# Patient Record
Sex: Male | Born: 1980 | Hispanic: Yes | Marital: Single | Smoking: Never smoker
Health system: Southern US, Community
[De-identification: ages and names within clinical notes are randomized; demographics above are authoritative.]

## PROBLEM LIST (undated history)

## (undated) DIAGNOSIS — E785 Hyperlipidemia, unspecified: Secondary | ICD-10-CM

## (undated) DIAGNOSIS — K469 Unspecified abdominal hernia without obstruction or gangrene: Secondary | ICD-10-CM

## (undated) DIAGNOSIS — T7840XA Allergy, unspecified, initial encounter: Secondary | ICD-10-CM

## (undated) HISTORY — DX: Allergy, unspecified, initial encounter: T78.40XA

## (undated) HISTORY — DX: Unspecified abdominal hernia without obstruction or gangrene: K46.9

## (undated) HISTORY — PX: LEG SURGERY: SHX1003

## (undated) HISTORY — DX: Hyperlipidemia, unspecified: E78.5

---

## 2010-12-18 ENCOUNTER — Encounter (INDEPENDENT_AMBULATORY_CARE_PROVIDER_SITE_OTHER): Payer: Self-pay | Admitting: General Surgery

## 2010-12-18 ENCOUNTER — Ambulatory Visit (INDEPENDENT_AMBULATORY_CARE_PROVIDER_SITE_OTHER): Payer: BC Managed Care – PPO | Admitting: General Surgery

## 2010-12-18 VITALS — BP 132/86 | HR 66 | Temp 97.2°F | Ht 63.0 in | Wt 207.8 lb

## 2010-12-18 DIAGNOSIS — K429 Umbilical hernia without obstruction or gangrene: Secondary | ICD-10-CM | POA: Insufficient documentation

## 2010-12-18 NOTE — Progress Notes (Signed)
Subjective:     Patient ID: Aaron Mercer, male   DOB: 1980/09/06, 30 y.o.   MRN: [id number]  HPI The patient is a 30 year old Hispanic male who has noticed a bulge in his belly button for the last 3-4 years. He has some occasional pain associated with this. He denies any nausea or vomiting. No fevers or chills. No chest pain or shortness of breath. His bowels move regularly.  Review of Systems  Constitutional: Negative.   HENT: Negative.   Eyes: Negative.   Respiratory: Negative.   Cardiovascular: Negative.   Gastrointestinal: Negative.   Genitourinary: Negative.   Musculoskeletal: Negative.   Skin: Negative.   Neurological: Negative.   Hematological: Negative.   Psychiatric/Behavioral: Negative.    Past Medical History  Diagnosis Date  . Hernia   . Hyperlipidemia    Past Surgical History  Procedure Date  . Leg surgery     rod put in femur   Current outpatient prescriptions:simvastatin (ZOCOR) 20 MG tablet, Daily., Disp: , Rfl:   No Known Allergies     Objective:   Physical Exam  Constitutional: He is oriented to person, place, and time. He appears well-developed and well-nourished.  HENT:  Head: Normocephalic and atraumatic.  Eyes: Conjunctivae and EOM are normal. Pupils are equal, round, and reactive to light.  Neck: Normal range of motion. Neck supple.  Cardiovascular: Normal rate, regular rhythm and normal heart sounds.   Pulmonary/Chest: Effort normal and breath sounds normal.  Abdominal: Soft. Bowel sounds are normal.       Small reducible umbilical hernia associated with mild tenderness.  Musculoskeletal: Normal range of motion.  Neurological: He is alert and oriented to person, place, and time.  Skin: Skin is warm and dry.  Psychiatric: He has a normal mood and affect. His behavior is normal.       Assessment:     Small reducible but symptomatic umbilical hernia.    Plan:     Because of the risk of incarceration and strangulation I think he would  benefit from having this fixed. He would also like to have this done. I've discussed with him in detail the risks and benefits the operation to fix the hernia as well as some of the technical aspects including the possibility of using mesh he understands and wishes to proceed.

## 2011-01-09 ENCOUNTER — Encounter (HOSPITAL_BASED_OUTPATIENT_CLINIC_OR_DEPARTMENT_OTHER)
Admission: RE | Admit: 2011-01-09 | Discharge: 2011-01-09 | Disposition: A | Discharge status: Home / Self Care | Payer: BC Managed Care – PPO | Source: Ambulatory Visit | Attending: Otolaryngology | Admitting: Otolaryngology

## 2011-01-09 LAB — DIFFERENTIAL
Basophils Absolute: 0 10*3/uL (ref 0.0–0.1)
Basophils Relative: 0 % (ref 0–1)
Eosinophils Absolute: 0.3 10*3/uL (ref 0.0–0.7)
Neutro Abs: 4 10*3/uL (ref 1.7–7.7)
Neutrophils Relative %: 53 % (ref 43–77)

## 2011-01-09 LAB — BASIC METABOLIC PANEL
CO2: 24 mEq/L (ref 19–32)
GFR calc non Af Amer: >60 mL/min (ref >60)
Glucose, Bld: 118 mg/dL — ABNORMAL HIGH (ref 70–99)
Potassium: 3.7 mEq/L (ref 3.5–5.1)
Sodium: 141 mEq/L (ref 135–145)

## 2011-01-09 LAB — CBC
Hemoglobin: 14.8 g/dL (ref 13.0–17.0)
Platelets: 227 10*3/uL (ref 150–400)
RBC: 4.92 MIL/uL (ref 4.22–5.81)
WBC: 7.5 10*3/uL (ref 4.0–10.5)

## 2011-01-12 ENCOUNTER — Ambulatory Visit (HOSPITAL_BASED_OUTPATIENT_CLINIC_OR_DEPARTMENT_OTHER)
Admission: RE | Admit: 2011-01-12 | Discharge: 2011-01-12 | Disposition: A | Discharge status: Home / Self Care | Payer: BC Managed Care – PPO | Source: Ambulatory Visit | Attending: General Surgery | Admitting: General Surgery

## 2011-01-12 DIAGNOSIS — Z01812 Encounter for preprocedural laboratory examination: Secondary | ICD-10-CM | POA: Insufficient documentation

## 2011-01-12 DIAGNOSIS — K429 Umbilical hernia without obstruction or gangrene: Secondary | ICD-10-CM

## 2011-01-12 HISTORY — PX: HERNIA REPAIR: SHX51

## 2011-01-12 LAB — POCT HEMOGLOBIN-HEMACUE: Hemoglobin: 13.4 g/dL (ref 13.0–17.0)

## 2011-01-16 NOTE — Op Note (Signed)
  NAME:  IBRAHIM, MCPHEETERS NO.:  [id number]  MEDICAL RECORD NO.:  [id number]  LOCATION:                                 FACILITY:  PHYSICIAN:  Ollen Gross. Vernell Morgans, M.D.      DATE OF BIRTH:  DATE OF PROCEDURE:  01/12/2011 DATE OF DISCHARGE:                              OPERATIVE REPORT   PREOPERATIVE DIAGNOSIS:  Umbilical hernia.  POSTOPERATIVE DIAGNOSIS:  Umbilical hernia.  PROCEDURE:  Umbilical hernia repair.  SURGEON:  Ollen Gross. Vernell Morgans, MD  ANESTHESIA:  General endotracheal.  PROCEDURE:  After informed consent was obtained, the patient was brought to the operating room and placed in supine position on the operating room table.  After adequate induction of general anesthesia, the patient's abdomen was prepped with ChloraPrep, allowed to dry, and then draped in the usual sterile manner.  The area around the umbilicus was infiltrated with 0.25% Marcaine.  A small infraumbilical incision was made with a 15-blade knife.  This incision was carried down through the skin and subcutaneous tissue sharply with the electrocautery.  Once into the subcutaneous tissue, we were able to identify the hernia sac.  It appeared to be just some preperitoneal fat.  The hernial was freed from the rest of subcutaneous tissue by combination of blunt hemostat dissection and some sharp dissection with the electrocautery.  The hernia was then easily able to be reduced without having to open the sac.  The fascia was very healthy appearing.  No other palpable hernias were identified.  The hernia defect itself was very small so that the end of my index finger could not fit through the opening.  We therefore decided to repair the hernia primarily without using mesh.  The fascial defect was closed with 3 interrupted #1 Novafil stitches.  Once this was accomplished, the hernia was well repaired without any tension.  The subcutaneous tissue was irrigated with copious amounts of saline  and infiltrated with more 0.25% Marcaine.  The deep layer of the wound was closed with interrupted 2-0 Vicryl stitches and the skin was closed with interrupted 4-0 Monocryl subcuticular stitches.  Dermabond dressings were applied.  The patient tolerated the procedure well.  At the end of the case, all needle, sponge and instruments counts were correct.  The patient was awakened and taken to recovery room in stable condition.     Ollen Gross. Vernell Morgans, M.D.     PST/MEDQ  D:  01/12/2011  T:  01/13/2011  Job:  [id number]  Electronically Signed by Chevis Pretty III M.D. on 01/16/2011 07:57:25 AM

## 2011-01-29 ENCOUNTER — Encounter (INDEPENDENT_AMBULATORY_CARE_PROVIDER_SITE_OTHER): Payer: BC Managed Care – PPO | Admitting: General Surgery

## 2011-01-30 ENCOUNTER — Encounter (INDEPENDENT_AMBULATORY_CARE_PROVIDER_SITE_OTHER): Payer: Self-pay | Admitting: General Surgery

## 2011-01-30 ENCOUNTER — Ambulatory Visit (INDEPENDENT_AMBULATORY_CARE_PROVIDER_SITE_OTHER): Payer: BC Managed Care – PPO | Admitting: General Surgery

## 2011-01-30 DIAGNOSIS — K429 Umbilical hernia without obstruction or gangrene: Secondary | ICD-10-CM

## 2011-01-30 NOTE — Progress Notes (Signed)
Subjective:     Patient ID: Aaron Mercer, male   DOB: 1981-03-13, 30 y.o.   MRN: [id number]  HPI The patient is a 30 year old Hispanic male who is now about 2 weeks out from a primary umbilical hernia repair. He is doing well. He has no complaints of pain. His appetite is good and his bowels are working normally.  Review of Systems     Objective:   Physical Exam On exam his abdomen is soft and nontender. His incision is healing nicely. There is no palpable evidence for recurrence of the hernia.    Assessment:     2 weeks postop from umbilical hernia repair.    Plan:     He is doing well. At this point I think he can return to work with light duty restrictions. We will plan to see him back in another month at which time if he is doing well we'll clear him for full activity.

## 2011-01-30 NOTE — Patient Instructions (Signed)
No heavy lifting 

## 2011-03-12 ENCOUNTER — Ambulatory Visit (INDEPENDENT_AMBULATORY_CARE_PROVIDER_SITE_OTHER): Payer: BC Managed Care – PPO | Admitting: General Surgery

## 2011-03-12 ENCOUNTER — Encounter (INDEPENDENT_AMBULATORY_CARE_PROVIDER_SITE_OTHER): Payer: Self-pay | Admitting: General Surgery

## 2011-03-12 VITALS — BP 140/88 | HR 66 | Temp 97.4°F | Resp 16 | Ht 63.0 in | Wt 209.0 lb

## 2011-03-12 DIAGNOSIS — K429 Umbilical hernia without obstruction or gangrene: Secondary | ICD-10-CM

## 2011-03-12 NOTE — Patient Instructions (Signed)
May return to all normal activities 

## 2011-03-12 NOTE — Progress Notes (Signed)
Subjective:     Patient ID: Aaron Mercer, male   DOB: August 24, 1980, 30 y.o.   MRN: [id number]  HPI The patient is a 30 year old Hispanic male who is now 32 months out from a primary umbilical hernia repair. He is tender well. He has no complaints today. He does not have any pain. His appetite is good and his bowels are working normally.  Review of Systems     Objective:   Physical Exam On exam his abdomen is soft and nontender. His incision is healed nicely. He has no palpable evidence for recurrence of the hernia.   Assessment:     2 months status post primary umbilical hernia repair    Plan:     At this point I believe he can return of his normal activities without any restrictions. We will plan to see him back on a p.r.n. basis.

## 2011-07-13 ENCOUNTER — Other Ambulatory Visit (INDEPENDENT_AMBULATORY_CARE_PROVIDER_SITE_OTHER): Payer: Self-pay | Admitting: General Surgery

## 2011-11-13 ENCOUNTER — Ambulatory Visit
Admission: RE | Admit: 2011-11-13 | Discharge: 2011-11-13 | Disposition: A | Discharge status: Home / Self Care | Payer: BC Managed Care – PPO | Source: Ambulatory Visit | Attending: Internal Medicine | Admitting: Internal Medicine

## 2011-11-13 ENCOUNTER — Other Ambulatory Visit: Payer: Self-pay | Admitting: Internal Medicine

## 2011-11-13 DIAGNOSIS — K228 Other specified diseases of esophagus: Secondary | ICD-10-CM

## 2011-11-13 MED ORDER — IOHEXOL 300 MG/ML  SOLN
75.0000 mL | Freq: Once | INTRAMUSCULAR | Status: AC | PRN
  Administered 2011-11-13: 75 mL via INTRAVENOUS

## 2012-01-23 ENCOUNTER — Ambulatory Visit (INDEPENDENT_AMBULATORY_CARE_PROVIDER_SITE_OTHER): Payer: BC Managed Care – PPO | Admitting: Family Medicine

## 2012-01-23 VITALS — BP 116/72 | HR 80 | Temp 98.3°F | Resp 16 | Ht 63.0 in | Wt 214.6 lb

## 2012-01-23 DIAGNOSIS — Z021 Encounter for pre-employment examination: Secondary | ICD-10-CM

## 2012-01-23 DIAGNOSIS — Z0289 Encounter for other administrative examinations: Secondary | ICD-10-CM

## 2012-01-23 NOTE — Progress Notes (Signed)
New Patient Visit:  HPI:  Pt presents today for preemployment physical.  Pt will be starting work at [employer].  No acute issues or concerns currently.  Pt with a noted hx/o abdominal hernia s/p repair 1-2 years. No abd pain or weakness since this point.  No hx/o back pain or back trauma.   Patient Active Problem List  Diagnosis  . Umbilical hernia   Past Medical History: Past Medical History  Diagnosis Date  . Hernia   . Hyperlipidemia     Past Surgical History: Past Surgical History  Procedure Date  . Leg surgery     rod put in femur  . Hernia repair 01/12/11    primary umbilical hernia repair    Social History: History   Social History  . Marital Status: Single    Spouse Name: N/A    Number of Children: N/A  . Years of Education: N/A   Social History Main Topics  . Smoking status: Never Smoker   . Smokeless tobacco: Not on file  . Alcohol Use: 0.0 oz/week    5-6 Cans of beer per week  . Drug Use: No  . Sexually Active:    Other Topics Concern  . Not on file   Social History Narrative  . No narrative on file    Family History: Family History  Problem Relation Age of Onset  . Hyperlipidemia Sister     Allergies: No Known Allergies  Current Outpatient Prescriptions  Medication Sig Dispense Refill  . simvastatin (ZOCOR) 20 MG tablet Daily.       Review Of Systems: 12 point ROS negative except as noted above in HPI.   Physical Exam: Filed Vitals:   01/23/12 1400  BP: 116/72  Pulse: 80  Temp: 98.3 F (36.8 C)  Resp: 16   General: alert, cooperative and obses  HEENT: PERRLA and extra ocular movement intact Heart: S1, S2 normal, no murmur, rub or gallop, regular rate and rhythm Lungs: clear to auscultation, no wheezes or rales and unlabored breathing Abdomen: abdomen is soft without significant tenderness, masses, organomegaly or guarding, no abdominal hernias present.  Extremities: extremities normal, atraumatic, no cyanosis or  edema Skin:no rashes, no ecchymoses Neurology: normal without focal findings   Assessment and Plan:  Otherwise normal physical exam. Cleared.  Discussed general health and well care.

## 2013-02-21 ENCOUNTER — Ambulatory Visit (INDEPENDENT_AMBULATORY_CARE_PROVIDER_SITE_OTHER): Payer: BC Managed Care – PPO | Admitting: Internal Medicine

## 2013-02-21 VITALS — BP 158/80 | HR 106 | Temp 99.5°F | Resp 19 | Wt 217.0 lb

## 2013-02-21 DIAGNOSIS — Z6838 Body mass index (BMI) 38.0-38.9, adult: Secondary | ICD-10-CM

## 2013-02-21 DIAGNOSIS — E785 Hyperlipidemia, unspecified: Secondary | ICD-10-CM

## 2013-02-21 DIAGNOSIS — J111 Influenza due to unidentified influenza virus with other respiratory manifestations: Secondary | ICD-10-CM

## 2013-02-21 MED ORDER — HYDROCODONE-HOMATROPINE 5-1.5 MG/5ML PO SYRP: 5.0000 mL | ORAL_SOLUTION | Freq: Four times a day (QID) | ORAL | Status: DC | PRN

## 2013-02-21 MED ORDER — MELOXICAM 15 MG PO TABS: 15.0000 mg | ORAL_TABLET | Freq: Every day | ORAL | Status: DC

## 2013-02-21 NOTE — Progress Notes (Signed)
  Subjective:    Patient ID: Aaron Mercer, male    DOB: 1981/05/14, 32 y.o.   MRN: [id number]  HPI  32 YO male patient comes in today with complaints of symptoms flu. He has had fever and body aches that started 2 days ago. He also complains of ear pain on the left side and sometimes on his right side. He has some nasal congestion that started last night. He has a cough just in the morning.  Denies sore throat. Denies general body aches. He does have some lower back pain. Denies N/V. Denies dysuria. Denies rash.  He has a hx of ear infections. His children have been sick recently.   Review of Systems  Constitutional: Positive for fever, chills, diaphoresis, activity change, appetite change and fatigue.  HENT: Positive for ear pain, congestion, rhinorrhea, voice change, postnasal drip, sinus pressure and ear discharge. Negative for hearing loss, sore throat, sneezing and tinnitus.   Eyes: Negative.   Respiratory: Positive for cough and wheezing.   Allergic/Immunologic: Positive for environmental allergies.       Objective:   Physical Exam BP 158/80  Pulse 106  Temp(Src) 99.5 F (37.5 C) (Oral)  Resp 19  Wt 217 lb (98.431 kg)  BMI 38.45 kg/m2  SpO2 100% Conj cl Tms cl Nose cl rhinor No nodes Chest clear incl forced expir Skin clear       Assessment & Plan:  Flu  Meds ordered this encounter  Medications  . HYDROcodone-homatropine (HYCODAN) 5-1.5 MG/5ML syrup    Sig: Take 5 mLs by mouth every 6 (six) hours as needed for cough.    Dispense:  120 mL    Refill:  0  . meloxicam (MOBIC) 15 MG tablet    Sig: Take 1 tablet (15 mg total) by mouth daily. For fever and cramps    Dispense:  10 tablet    Refill:  0   Rest/ fluids/ck 3 d if not well

## 2013-02-21 NOTE — Patient Instructions (Signed)
Sudafed from the pharmacy.

## 2013-04-10 ENCOUNTER — Ambulatory Visit (INDEPENDENT_AMBULATORY_CARE_PROVIDER_SITE_OTHER): Payer: BC Managed Care – PPO | Admitting: Emergency Medicine

## 2013-04-10 ENCOUNTER — Ambulatory Visit: Payer: BC Managed Care – PPO

## 2013-04-10 VITALS — BP 140/100 | HR 87 | Temp 98.1°F | Resp 18 | Ht 63.0 in | Wt 220.0 lb

## 2013-04-10 DIAGNOSIS — M25562 Pain in left knee: Secondary | ICD-10-CM

## 2013-04-10 DIAGNOSIS — M25569 Pain in unspecified knee: Secondary | ICD-10-CM

## 2013-04-10 DIAGNOSIS — M7652 Patellar tendinitis, left knee: Secondary | ICD-10-CM

## 2013-04-10 DIAGNOSIS — M7650 Patellar tendinitis, unspecified knee: Secondary | ICD-10-CM

## 2013-04-10 MED ORDER — PREDNISONE 10 MG PO TABS: ORAL_TABLET | ORAL | Status: DC

## 2013-04-10 MED ORDER — HYDROCODONE-ACETAMINOPHEN 5-325 MG PO TABS: 1.0000 | ORAL_TABLET | Freq: Four times a day (QID) | ORAL | Status: DC | PRN

## 2013-04-10 NOTE — Patient Instructions (Addendum)
He had an inflammation of the patellar tendon of your knees. Please apply ice to knee for 5-10 minutes every 2-3 hours. Try and avoid kneeling or excessive walking. We'll check a baseline sugar because he is going to be on steroids.

## 2013-04-10 NOTE — Progress Notes (Addendum)
This chart was scribed for Lesle Chris, MD by Joaquin Music, ED Scribe. This patient was seen in room Room/bed 5 and the patient's care was started at 10:15 AM. Subjective:    Patient ID: Aaron Mercer, male    DOB: April 04, 1981, 32 y.o.   MRN: [id number] Chief Complaint  Patient presents with  . Knee Pain    X yesterday, left knee   HPI Aaron Mercer is a 32 y.o. male who presents to the [facility] complaining of ongoing, worsening L knee pain that began today. He states when he woke up this morning, he had swelling and limited mobility. He states he his pain worsens when squatting/bending his knee. Pt denies having a hx of knee pain or gout. 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 Pt denies having pain in the R knee. Pt denies any recent injuries.  Review of Systems  Musculoskeletal: Positive for gait problem.  All other systems reviewed and are negative.   Objective:   Physical Exam CONSTITUTIONAL: Well developed/well nourished HEAD: Normocephalic/atraumatic EYES: EOMI/PERRL ENMT: Mucous membranes moist NECK: supple no meningeal signs SPINE:entire spine nontender CV: S1/S2 noted, no murmurs/rubs/gallops noted LUNGS: Lungs are clear to auscultation bilaterally, no apparent distress ABDOMEN: soft, nontender, no rebound or guarding GU:no cva tenderness NEURO: Pt is awake/alert, moves all extremitiesx4 EXTREMITIES: pulses normal, full ROM there is puffiness of the lower portion of the knee. There is significant tenderness over the patellar tendon. There is a negative drawer sign no joint instability. SKIN: warm, color normal PSYCH: no abnormalities of mood noted [facility] reading (PRIMARY) by  Dr.Johnetta Sloniker there is a linear calcification of the proximal tibia seen only on the AP views which appears to be in the bone please comment    Triage Vitals:BP 140/100  Pulse 87  Temp(Src) 98.1 F (36.7 C) (Oral)  Resp 18  Ht 5\' 3"  (1.6 m)  Wt 220 lb (99.791 kg)  BMI 38.98 kg/m2  SpO2  99% Assessment & Plan:  Patient placed on prednisone taper, hydrocodone for pain. recheck in one week if continued problem  I personally performed the services described in this documentation, which was scribed in my presence. The recorded information has been reviewed and is accurate.

## 2013-09-30 ENCOUNTER — Ambulatory Visit (INDEPENDENT_AMBULATORY_CARE_PROVIDER_SITE_OTHER): Payer: BC Managed Care – PPO | Admitting: Emergency Medicine

## 2013-09-30 VITALS — BP 152/106 | HR 109 | Temp 98.3°F | Resp 24 | Ht 62.0 in | Wt 229.0 lb

## 2013-09-30 DIAGNOSIS — J018 Other acute sinusitis: Secondary | ICD-10-CM

## 2013-09-30 DIAGNOSIS — E782 Mixed hyperlipidemia: Secondary | ICD-10-CM

## 2013-09-30 DIAGNOSIS — Z6838 Body mass index (BMI) 38.0-38.9, adult: Secondary | ICD-10-CM

## 2013-09-30 DIAGNOSIS — I10 Essential (primary) hypertension: Secondary | ICD-10-CM

## 2013-09-30 MED ORDER — AMOXICILLIN-POT CLAVULANATE 875-125 MG PO TABS: 1.0000 | ORAL_TABLET | Freq: Two times a day (BID) | ORAL | Status: AC

## 2013-09-30 MED ORDER — TRIAMCINOLONE ACETONIDE 55 MCG/ACT NA AERO: 2.0000 | INHALATION_SPRAY | Freq: Every day | NASAL | Status: AC

## 2013-09-30 MED ORDER — METOPROLOL SUCCINATE ER 50 MG PO TB24: 50.0000 mg | ORAL_TABLET | Freq: Every day | ORAL | Status: AC

## 2013-09-30 NOTE — Patient Instructions (Signed)
Hipertensión  (Hypertension)  Cuando el corazón late bombea la sangre a través de las arterias. La fuerza que se origina es la presión arterial. Si la presión es demasiado elevada, se denomina hipertensión. El peligro radica en que puede sufrirla y no saberlo. Hipertensión puede significar que su corazón debe trabajar más intensamente para bombear sangre. Las arterias pueden estar estrechas o rígidas. El trabajo extra aumenta el riesgo de enfermedades cardíacas, ictus y otros problemas.   La presión arterial está formada por dos números: el número mayor sobre el número menor, por ejemplo110/70. Se señala "110/72". Los valores ideales son por debajo de 120 para el número más alto (sistólica) y por debajo de 80 para el más bajo (diastólica). Anote su presión sanguínea hoy.  Debe prestar mucha atención a su presión arterial si sufre alguna otra enfermedad como:  · Insuficiencia cardíaca  · Ataques cardiacos previos  · Diabetes  · Enfermedad renal crónica  · Ictus previo  · Múltiples factores de riesgo para enfermedades cardíacas  Para diagnosticar si usted sufre hipertensión arterial, debe medirse la presión mientras encuentra sentado con el brazo elevado a la altura del nivel del corazón. Debe medirse al menos 2 veces. Una única lectura de presión arterial elevada (especialmente en el servicio de emergencias) no significa que necesita tratamiento. Hay enfermedades en las que la presión arterial es diferente en ambos brazos. Es importante que consulte rápidamente con su médico para un control.  La mayoría de las personas sufren hipertensión esencial, lo que significa que no tiene una causa específica. Este tipo de hipertensión puede bajarse modificando algunos factores en el estilo de vida como:  · Estrés.  · El consumo de cigarrillos.  · La falta de actividad física.  · Peso excesivo  · Consumo de drogas y alcohol.  · Consumiendo menos sal  La mayoría de las personas no tienen síntomas hasta que la hipertensión  ocasiona un daño en el organismo. El tratamiento efectivo puede evitar, demorar o reducir ese daño.  TRATAMIENTO:  El tratamiento para la hipertensión, cuando se ha identificado una causa, está dirigido a la misma Hay un gran número en medicamentos para tratarla. Se agrupan en diferentes categorías y el médico seleccionará los medicamentos indicados para usted. Muchos medicamentos disponibles tienen efectos secundarios. Debe comentar los efectos secundarios con su médico.  Si la presión arterial permanece elevada después de modificar su estilo de vida o comenzar a tomar medicamentos:  · Los medicamentos deben ser reemplazados  · Puede ser necesario evaluar otros problemas.  · Debe estar seguro que comprende las indicaciones, que sabe cómo y cuándo tomar los medicamentos.  · Asegúrese de realizar un control con su médico dentro del tiempo indicado (generalmente dentro de las dos semanas) para volver a evaluar la presión arterial y revisar los medicamentos prescritos.  · Si está tomando más de un medicamento para la presión arterial, asegúrese que sabe cómo y en qué momentos debe tomarlos. Tomar los medicamentos al mismo tiempo puede dar como resultado un gran descenso en la presión arterial.  SOLICITE ATENCIÓN MÉDICA DE INMEDIATO SI PRESENTA:  · Dolor de cabeza intenso, visión borrosa o cambios en la visión, o confusión.  · Debilidad o adormecimientos inusuales o sensación de desmayo.  · Dolor de pecho o abdominal intenso, vómitos o problemas para respirar.  ASEGÚRESE QUE:   · Comprende estas instrucciones.  · Controlará su enfermedad.  · Solicitará ayuda inmediatamente si no mejora o si empeora.  Document Released: 05/07/2005 Document Revised: 07/30/2011  Exit[employer]®   Patient Information ©2014 [employer].

## 2013-09-30 NOTE — Progress Notes (Signed)
[facility] [address], [place] [id number] [phone]  Date:  09/30/2013   Name:  Aaron MarseilleCarlos R Calef   DOB:  08/24/1980   MRN:  [id number]  PCP:  Tally DueGUEST, CHRIS WARREN, MD    Chief Complaint: Sinusitis and Labs Only   History of Present Illness:  Aaron MarseilleCarlos R Housel is a 33 y.o. very pleasant male patient who presents with the following:  Ill for a week with nasal congestion and drainage purulent in nature.  Has post nasal drainage.  No sore throat or cough, wheezing or shortness of breath.  No fever or chills.  No nausea or vomiting. No stool change or rash.   History of hyperlipidemia off medication currently.  Wishes retest.  Fasting.  Non smoker.  No improvement with over the counter medications or other home remedies. Denies other complaint or health concern today.   Patient Active Problem List   Diagnosis Date Noted  . Other and unspecified hyperlipidemia 02/21/2013  . BMI 38.0-38.9,adult 02/21/2013  . Umbilical hernia 12/18/2010    Past Medical History  Diagnosis Date  . Hernia   . Hyperlipidemia   . Allergy     Past Surgical History  Procedure Laterality Date  . Leg surgery      rod put in femur  . Hernia repair  01/12/11    primary umbilical hernia repair    History  Substance Use Topics  . Smoking status: Never Smoker   . Smokeless tobacco: Not on file  . Alcohol Use: 0.0 oz/week    5-6 Cans of beer per week    Family History  Problem Relation Age of Onset  . Hyperlipidemia Sister     No Known Allergies  Medication list has been reviewed and updated.  Current Outpatient Prescriptions on File Prior to Visit  Medication Sig Dispense Refill  . simvastatin (ZOCOR) 20 MG tablet Daily.       No current facility-administered medications on file prior to visit.    Review of Systems:  As per HPI, otherwise negative.    Physical Examination: Filed Vitals:   09/30/13 1427  BP: 152/106  Pulse: 109  Temp: 98.3 F (36.8 C)   Resp: 24   Filed Vitals:   09/30/13 1427  Height: 5\' 2"  (1.575 m)  Weight: 229 lb (103.874 kg)   Body mass index is 41.87 kg/(m^2). Ideal Body Weight: Weight in (lb) to have BMI = 25: 136.4  GEN: obese, NAD, Non-toxic, A & O x 3 HEENT: Atraumatic, Normocephalic. Neck supple. No masses, No LAD. Ears and Nose: No external deformity.  Marked nasal congestion CV: RRR, No M/G/R. No JVD. No thrill. No extra heart sounds. PULM: CTA B, no wheezes, crackles, rhonchi. No retractions. No resp. distress. No accessory muscle use. ABD: S, NT, ND, +BS. No rebound. No HSM. EXTR: No c/c/e NEURO Normal gait.  PSYCH: Normally interactive. Conversant. Not depressed or anxious appearing.  Calm demeanor.    Assessment and Plan: Sinusitis Hypertension.  Had borderline elevations last sever visits.  Now up further.  No decongestants currently Hyperlipidemia   Signed,  Phillips OdorJeffery Stanislawa Gaffin, MD

## 2013-10-06 NOTE — Addendum Note (Signed)
Addended by: Sheldon SilvanEIS, Faline Langer E on: 10/06/2013 10:56 AM   Modules accepted: Orders

## 2014-10-07 ENCOUNTER — Other Ambulatory Visit: Payer: Self-pay | Admitting: Emergency Medicine

## 2014-11-16 IMAGING — CR DG KNEE COMPLETE 4+V*L*
5 series · 5 of 5 positions shown · non-contrast
Comparison: None.

CLINICAL DATA: Left knee pain

EXAM:
LEFT KNEE - COMPLETE 4+ VIEW

[AP]
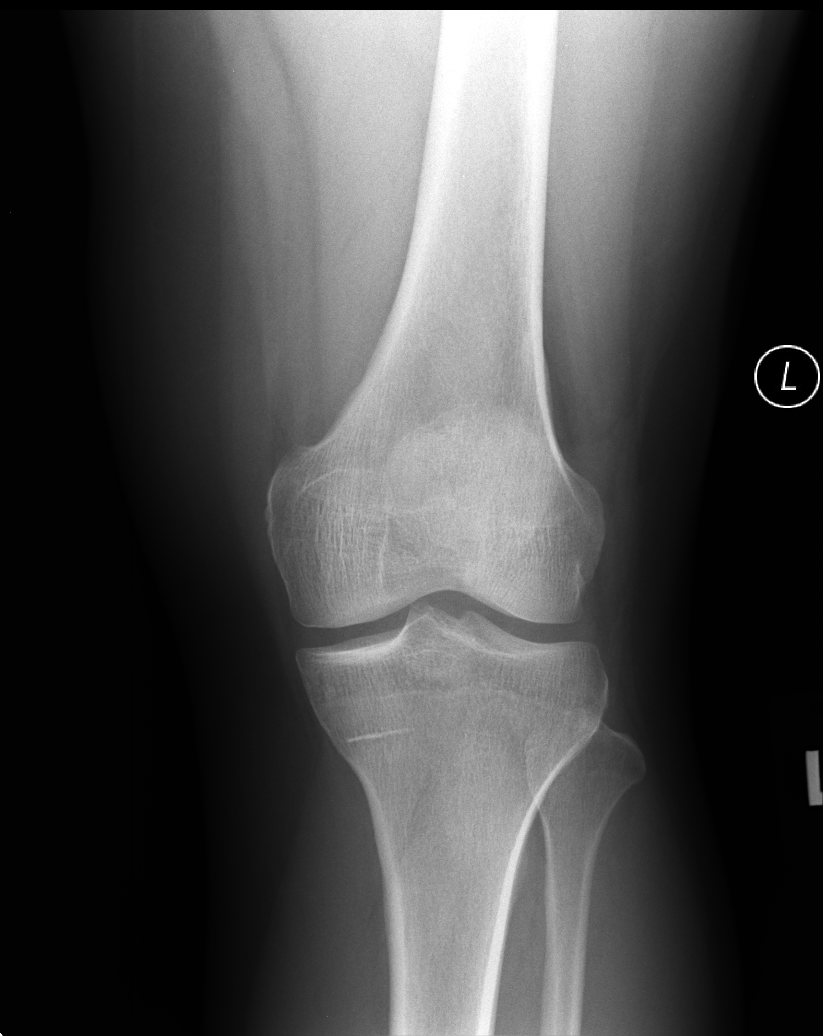

[lateral]
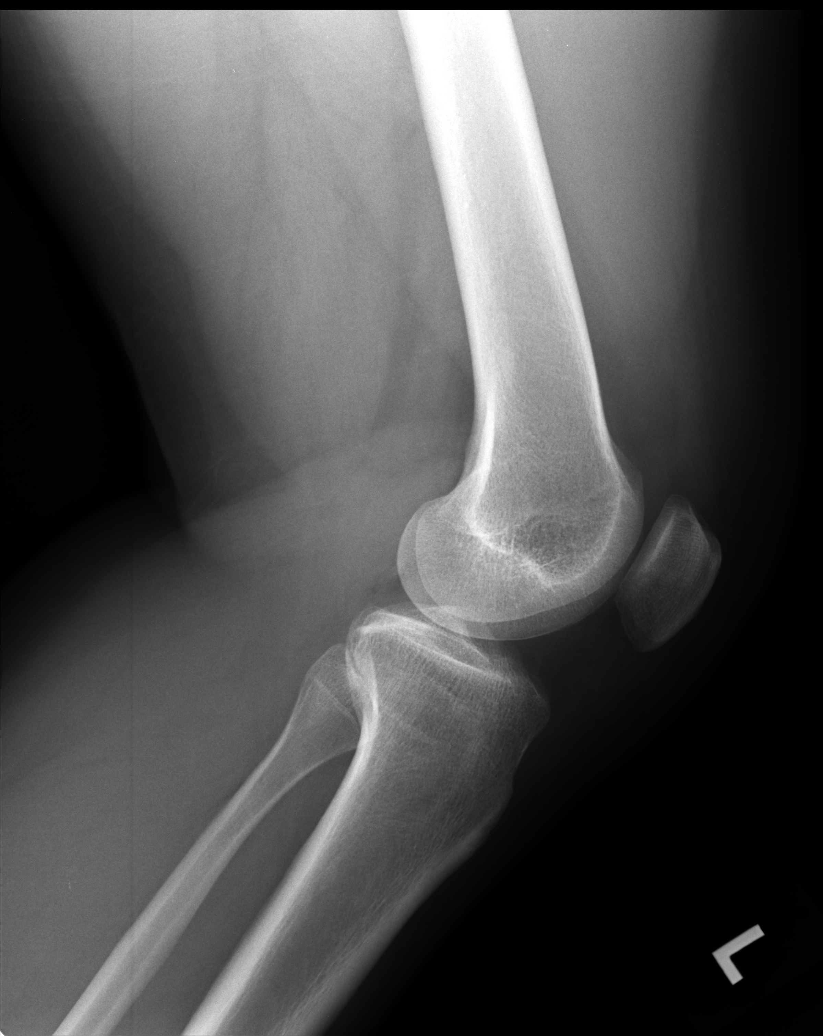

[ap ext rot]
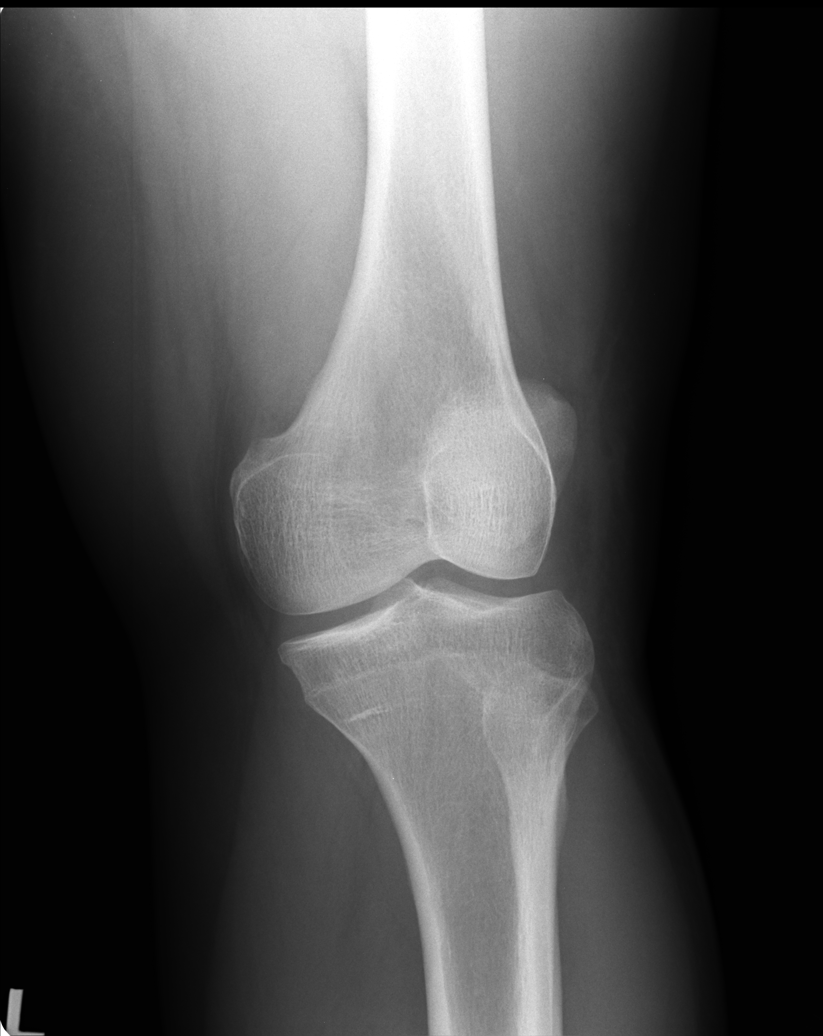

[ap int rot]
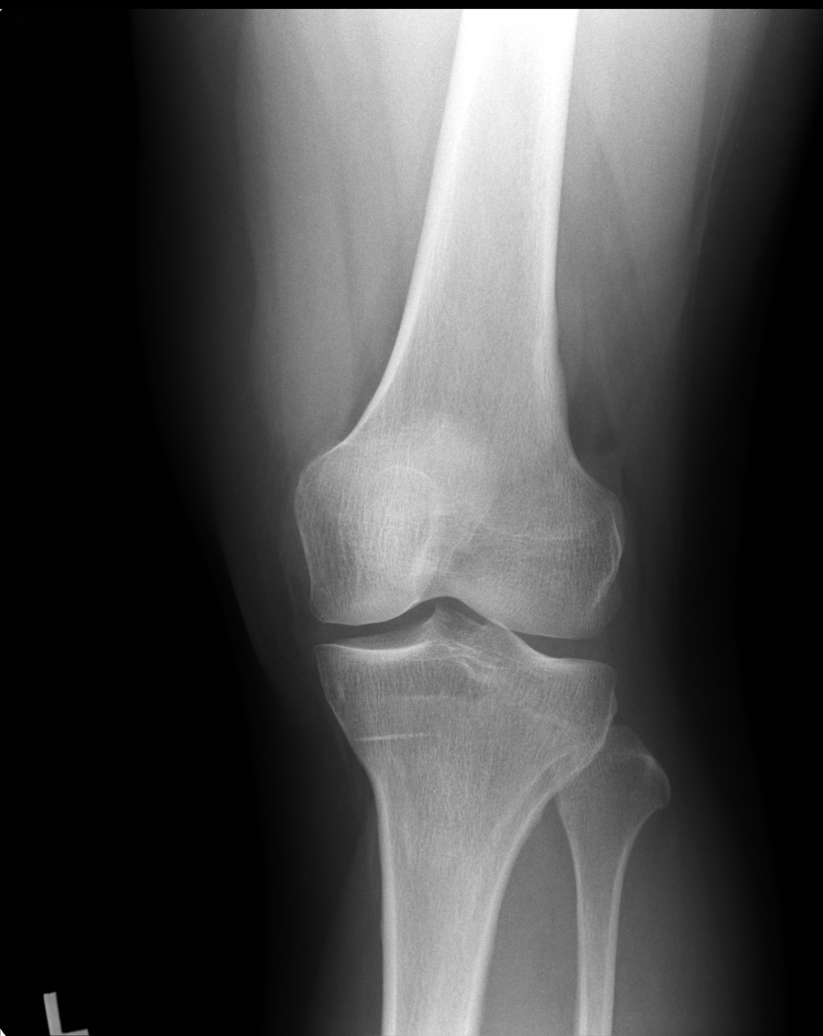

[sunrise]
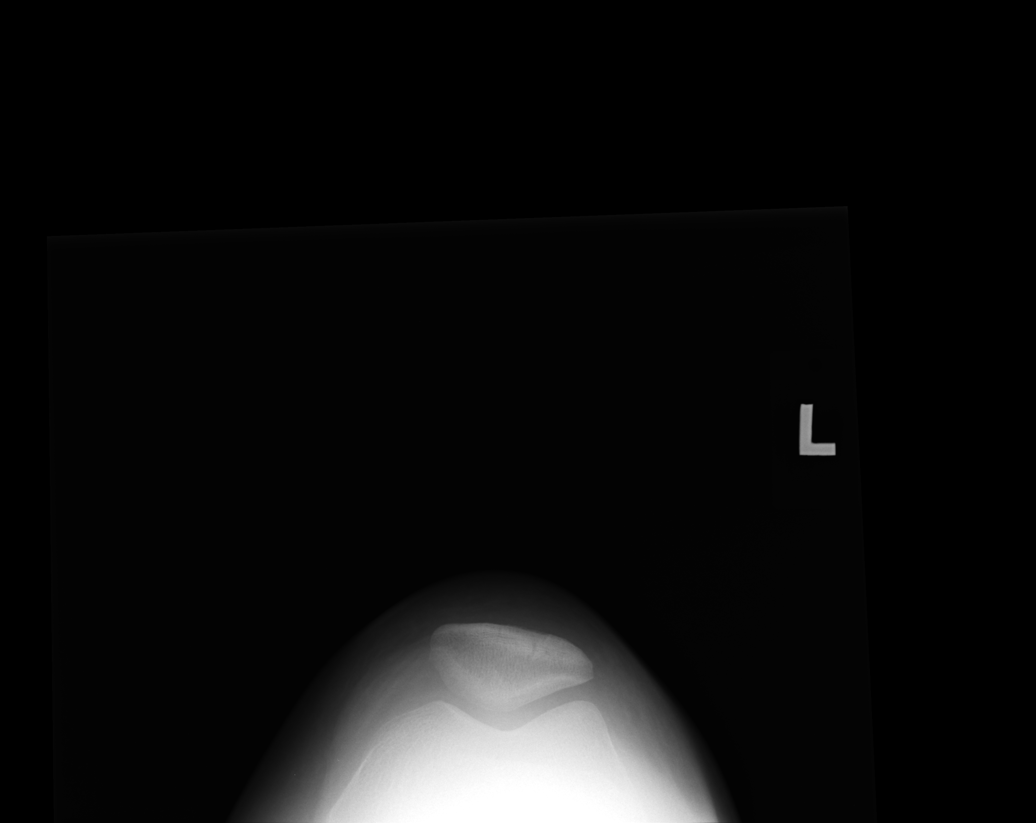

[5 of 5 positions shown; findings below may reference images not displayed]

FINDINGS: No fracture or dislocation is seen.

The joint spaces are preserved.

The visualized soft tissues are unremarkable.

No suprapatellar knee joint effusion.

Linear density along the proximal medial tibia reflects benign
cortical sclerosis.
IMPRESSION: No acute osseous abnormality is seen.

## 2020-07-12 ENCOUNTER — Other Ambulatory Visit: Payer: Self-pay

## 2020-07-12 ENCOUNTER — Ambulatory Visit (HOSPITAL_COMMUNITY)
Admission: EM | Admit: 2020-07-12 | Discharge: 2020-07-12 | Disposition: A | Discharge status: Home / Self Care | Payer: BC Managed Care – PPO | Attending: Emergency Medicine | Admitting: Emergency Medicine

## 2020-07-12 ENCOUNTER — Encounter (HOSPITAL_COMMUNITY): Payer: Self-pay

## 2020-07-12 DIAGNOSIS — M25572 Pain in left ankle and joints of left foot: Secondary | ICD-10-CM

## 2020-07-12 DIAGNOSIS — M109 Gout, unspecified: Secondary | ICD-10-CM | POA: Diagnosis not present

## 2020-07-12 MED ORDER — PREDNISONE 10 MG (21) PO TBPK: ORAL_TABLET | Freq: Every day | ORAL | 0 refills | Status: AC

## 2020-07-12 NOTE — ED Provider Notes (Signed)
[facility]    CSN: [id number] Arrival date & time: 07/12/20  3662      History   Chief Complaint Chief Complaint  Patient presents with  . Leg Pain    left    HPI Aaron Mercer is a 40 y.o. male.   Patient presents with pain and swelling in his left ankle x2 weeks.  No falls or injury.  He states this is similar to previous episodes of gout but states the gout episodes usually only last a couple of days and then resolve with medication.  He has been taking meloxicam without relief this time.  He denies fever, chills, wounds, redness, bruising, is, weakness, or other symptoms.  His medical history includes hypertension, hyperlipidemia, obesity, gout, elevated blood uric acid level.  The history is provided by the patient and medical records. A language interpreter was used.    Past Medical History:  Diagnosis Date  . Allergy   . Hernia   . Hyperlipidemia     Patient Active Problem List   Diagnosis Date Noted  . Other and unspecified hyperlipidemia 02/21/2013  . BMI 38.0-38.9,adult 02/21/2013  . Umbilical hernia 12/18/2010    Past Surgical History:  Procedure Laterality Date  . HERNIA REPAIR  01/12/11   primary umbilical hernia repair  . LEG SURGERY     rod put in femur       Home Medications    Prior to Admission medications   Medication Sig Start Date End Date Taking? Authorizing Provider  predniSONE (STERAPRED UNI-PAK 21 TAB) 10 MG (21) TBPK tablet Take by mouth daily. As directed 07/12/20  Yes Mickie Bail, NP  amoxicillin-clavulanate (AUGMENTIN) 875-125 MG per tablet Take 1 tablet by mouth 2 (two) times daily. 09/30/13   Carmelina Dane, MD  metoprolol succinate (TOPROL-XL) 50 MG 24 hr tablet Take 1 tablet (50 mg total) by mouth daily. Take with or immediately following a meal. 09/30/13   Carmelina Dane, MD  simvastatin (ZOCOR) 20 MG tablet Daily. 12/15/10   [provider]  triamcinolone (NASACORT AQ) 55 MCG/ACT AERO nasal  inhaler Place 2 sprays into the nose daily. 09/30/13   Carmelina Dane, MD    Family History Family History  Problem Relation Age of Onset  . Hyperlipidemia Sister     Social History Social History   Tobacco Use  . Smoking status: Never Smoker  . Smokeless tobacco: Never Used  Substance Use Topics  . Alcohol use: Yes    Alcohol/week: 5.0 - 6.0 standard drinks    Types: 5 - 6 Cans of beer per week  . Drug use: No     Allergies   Patient has no known allergies.   Review of Systems Review of Systems  Constitutional: Negative for chills and fever.  HENT: Negative for ear pain and sore throat.   Eyes: Negative for pain and visual disturbance.  Respiratory: Negative for cough and shortness of breath.   Cardiovascular: Negative for chest pain and palpitations.  Gastrointestinal: Negative for abdominal pain and vomiting.  Genitourinary: Negative for dysuria and hematuria.  Musculoskeletal: Positive for arthralgias, gait problem and joint swelling.  Skin: Negative for color change, rash and wound.  Neurological: Negative for syncope, weakness and numbness.  All other systems reviewed and are negative.    Physical Exam Triage Vital Signs ED Triage Vitals  Enc Vitals Group     BP      Pulse      Resp  Temp      Temp src      SpO2      Weight      Height      Head Circumference      Peak Flow      Pain Score      Pain Loc      Pain Edu?      Excl. in GC?    No data found.  Updated Vital Signs BP 130/87 (BP Location: Right Arm)   Pulse 77   Temp 99.6 F (37.6 C) (Temporal)   Resp 17   SpO2 100%   Visual Acuity Right Eye Distance:   Left Eye Distance:   Bilateral Distance:    Right Eye Near:   Left Eye Near:    Bilateral Near:     Physical Exam Vitals and nursing note reviewed.  Constitutional:      General: He is not in acute distress.    Appearance: He is well-developed and well-nourished.  HENT:     Head: Normocephalic and atraumatic.      Mouth/Throat:     Mouth: Mucous membranes are moist.  Eyes:     Conjunctiva/sclera: Conjunctivae normal.  Cardiovascular:     Rate and Rhythm: Normal rate and regular rhythm.     Heart sounds: Normal heart sounds.  Pulmonary:     Effort: Pulmonary effort is normal. No respiratory distress.     Breath sounds: Normal breath sounds.  Abdominal:     Palpations: Abdomen is soft.     Tenderness: There is no abdominal tenderness.  Musculoskeletal:        General: Swelling and tenderness present. No edema. Normal range of motion.     Cervical back: Neck supple.       Feet:     Comments: Tenderness of left lateral ankle; generalized edema.  2+ pedal pulse, sensation intact, FROM.  Skin:    General: Skin is warm and dry.     Capillary Refill: Capillary refill takes less than 2 seconds.     Findings: No bruising, erythema, lesion or rash.  Neurological:     General: No focal deficit present.     Mental Status: He is alert and oriented to person, place, and time.     Sensory: No sensory deficit.     Motor: No weakness.     Gait: Gait abnormal.     Comments: Ambulatory with crutches.  Psychiatric:        Mood and Affect: Mood and affect and mood normal.        Behavior: Behavior normal.      UC Treatments / Results  Labs (all labs ordered are listed, but only abnormal results are displayed) Labs Reviewed - No data to display  EKG   Radiology No results found.  Procedures Procedures (including critical care time)  Medications Ordered in UC Medications - No data to display  Initial Impression / Assessment and Plan / UC Course  I have reviewed the triage vital signs and the nursing notes.  Pertinent labs & imaging results that were available during my care of the patient were reviewed by me and considered in my medical decision making (see chart for details).   Left ankle gout, left ankle pain.  Treating with prednisone taper.  Instructed patient to follow-up with his  PCP if his symptoms are not improving.  He agrees to plan of care.   Final Clinical Impressions(s) / UC Diagnoses   Final diagnoses:  Acute left ankle pain  Acute gout of left ankle, unspecified cause     Discharge Instructions     Take the prednisone as directed.    Follow up with your primary care provider if your symptoms are not improving.        ED Prescriptions    Medication Sig Dispense Auth. Provider   predniSONE (STERAPRED UNI-PAK 21 TAB) 10 MG (21) TBPK tablet Take by mouth daily. As directed 21 tablet Mickie Bail, NP     PDMP not reviewed this encounter.   Mickie Bail, NP 07/12/20 1100

## 2020-07-12 NOTE — Discharge Instructions (Signed)
Take the prednisone as directed.  Follow up with your primary care provider if your symptoms are not improving.    

## 2020-07-12 NOTE — ED Triage Notes (Signed)
Pt presents with pain to left leg X 2 weeks. Pt states he had mild pain and days later he started to notice swelling. Pt denies injury and states he believes it is related to gout.
